# Patient Record
Sex: Female | Born: 1980 | Race: White | Hispanic: No | Marital: Single | Smoking: Current every day smoker
Health system: Southern US, Community
[De-identification: ages and names within clinical notes are randomized; demographics above are authoritative.]

## PROBLEM LIST (undated history)

## (undated) ENCOUNTER — Emergency Department (HOSPITAL_COMMUNITY): Discharge status: Absent without leave | Payer: Self-pay | Source: Home / Self Care

## (undated) DIAGNOSIS — F1910 Other psychoactive substance abuse, uncomplicated: Secondary | ICD-10-CM

## (undated) DIAGNOSIS — G8929 Other chronic pain: Secondary | ICD-10-CM

## (undated) DIAGNOSIS — F419 Anxiety disorder, unspecified: Secondary | ICD-10-CM

## (undated) HISTORY — PX: NOSE SURGERY: SHX723

---
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
## 2002-06-21 ENCOUNTER — Emergency Department (HOSPITAL_COMMUNITY): Admission: EM | Admit: 2002-06-21 | Discharge: 2002-06-21 | Payer: Self-pay | Admitting: *Deleted

## 2002-06-21 ENCOUNTER — Encounter: Payer: Self-pay | Admitting: *Deleted

## 2002-11-06 ENCOUNTER — Ambulatory Visit (HOSPITAL_COMMUNITY): Admission: AD | Admit: 2002-11-06 | Discharge: 2002-11-07 | Payer: Self-pay | Admitting: Obstetrics and Gynecology

## 2003-01-05 ENCOUNTER — Inpatient Hospital Stay (HOSPITAL_COMMUNITY): Admission: AD | Admit: 2003-01-05 | Discharge: 2003-01-08 | Payer: Self-pay | Admitting: Obstetrics and Gynecology

## 2003-11-24 ENCOUNTER — Emergency Department (HOSPITAL_COMMUNITY): Admission: EM | Admit: 2003-11-24 | Discharge: 2003-11-25 | Payer: Self-pay | Admitting: *Deleted

## 2005-01-01 ENCOUNTER — Ambulatory Visit (HOSPITAL_COMMUNITY): Admission: RE | Admit: 2005-01-01 | Discharge: 2005-01-01 | Payer: Self-pay | Admitting: General Surgery

## 2005-02-03 ENCOUNTER — Emergency Department (HOSPITAL_COMMUNITY): Admission: EM | Admit: 2005-02-03 | Discharge: 2005-02-03 | Payer: Self-pay | Admitting: *Deleted

## 2005-06-26 ENCOUNTER — Ambulatory Visit (HOSPITAL_COMMUNITY): Admission: RE | Admit: 2005-06-26 | Discharge: 2005-06-26 | Payer: Self-pay | Admitting: Family Medicine

## 2009-07-10 ENCOUNTER — Ambulatory Visit (HOSPITAL_COMMUNITY): Admission: RE | Admit: 2009-07-10 | Discharge: 2009-07-10 | Payer: Self-pay | Admitting: Family Medicine

## 2010-07-27 ENCOUNTER — Emergency Department (HOSPITAL_COMMUNITY)
Admission: EM | Admit: 2010-07-27 | Discharge: 2010-07-27 | Payer: Self-pay | Source: Home / Self Care | Admitting: Emergency Medicine

## 2010-09-08 ENCOUNTER — Encounter: Payer: Self-pay | Admitting: Family Medicine

## 2011-01-04 NOTE — Op Note (Signed)
   NAME:  Lauren Hunt, Lauren Hunt                          ACCOUNT NO.:  [id number]   MEDICAL RECORD NO.:  [id number]                   PATIENT TYPE:  OBV   LOCATION:  LDR1                                 FACILITY:  [facility]   PHYSICIAN:  Lazaro Arms, M.D.                DATE OF BIRTH:  [birth year]   DATE OF PROCEDURE:  DATE OF DISCHARGE:                                 OPERATIVE REPORT   DELIVERY NOTE:  The patient received her epidural and shortly thereafter  complained of lots of rectal pressure and was noted to be fully dilated at  +2 station.  She pushed effectively over about 30 minutes and delivered a  viable female infant at 67.  The mouth and nose were suctioned on the  perineum.  No meconium was seen in the fluid.  Apgars are 9 and 9, weight 7  pounds 14 ounces. Twenty units of Pitocin diluted in 1000 mL of lactated  Ringer's was infused rapidly IV.  The placenta separated spontaneously and  was delivered via controlled cord traction at 1400.  It was inspected and  appears to be intact with a three-vessel cord.  The vagina was then  inspected and a shallow second degree laceration was noted.  It was  infiltrated with 10 mL of 1% Xylocaine and repaired with a 2-0 Vicryl  suture.  The epidural catheter was then removed with the blue tip visualized  as being intact.  Estimated blood loss 300 mL.     Jacklyn Shell, C.N.M.          Lazaro Arms, M.D.    FC/MEDQ  D:  01/06/2003  T:  01/06/2003  Job:  [id number]

## 2011-01-04 NOTE — H&P (Signed)
NAME:  ICYSS, SKOG                          ACCOUNT NO.:  [id number]   MEDICAL RECORD NO.:  [id number]                   PATIENT TYPE:  OIB   LOCATION:  A415                                 FACILITY:  [facility]   PHYSICIAN:  Lazaro Arms, M.D.                DATE OF BIRTH:  12-28-1980   DATE OF ADMISSION:  01/05/2003  DATE OF DISCHARGE:                                HISTORY & PHYSICAL   HISTORY OF PRESENT ILLNESS:  Ashleymarie is a 30 year old white female, gravida 1,  para 0, abortus 0, with estimated date of delivery of Dec 31, 2002,  currently at 40-5/[redacted] weeks gestation based on Jan 05, 2003, admission, with  an unfavorable cervix in the office on Jan 03, 2003.  The cervix is  basically long, thick, and closed, vertex, and ballotable.  Estimated fetal  weight by Leopold's is approximately 7-1/2 to 8 pounds, so a normal size  baby.  Pregnancy has otherwise been uncomplicated except for  gastroesophageal reflux which has responded to Prilosec.   PAST MEDICAL HISTORY:  1. Anxiety.  2. Migraines.   PAST SURGICAL HISTORY:  Nose surgery for deviated septum.   PAST OBSTETRICAL HISTORY:  She is nulliparous.   ALLERGIES:  No known drug allergies.   MEDICATIONS:  1. Prenatal vitamins.  2. Tylenol No.3 occasionally for headaches.  3. Prilosec.   SOCIAL HISTORY:  She smokes five cigarettes a day, does not drink alcohol or  do drugs.   LABORATORY DATA:  Blood type of B negative.  Father is also O negative.  RhoGAM was not done.  Urine drug screen was negative.  Rubella is immune.  Hepatitis B surface antigen was negative.  HIV was nonreactive.  Serology  was nonreactive.  Her Pap smear was normal.  GC and Chlamydia were both  negative.  AFT was normal.  Her Group B Strep was negative.  Her one hour  GTT was 106.   PHYSICAL EXAMINATION:  HEENT:  Unremarkable.  NECK:  Thyroid is normal.  LUNGS:  Clear.  BREASTS:  Deferred.  ABDOMEN:  Fundal height of 36 cm.  PELVIC:  Cervix is long,  thick, and closed.  EXTREMITIES:  Trace edema.  NEUROLOGIC:  Grossly intact.    IMPRESSION:  1. Intrauterine pregnancy at 40-5/[redacted] weeks gestation.  2. Unfavorable cervix.   PLAN:  The patient is admitted for cervical ripening with Cervidil, followed  by Pitocin induction.  She understands it will probably be a two day  process, and she is requesting an epidural to be placed when appropriate.                                                Lazaro Arms, M.D.    Loraine Maple  D:  01/03/2003  T:  01/03/2003  Job:  [id number]

## 2011-01-04 NOTE — Op Note (Signed)
[name], [name]                ACCOUNT NO.:  [id number]   MEDICAL RECORD NO.:  [id number]          PATIENT TYPE:  AMB   LOCATION:  DAY                           FACILITY:  [facility]   PHYSICIAN:  Jerolyn Shin C. Katrinka Blazing, M.D.   DATE OF BIRTH:  Sep 29, 1980   DATE OF PROCEDURE:  01/01/2005  DATE OF DISCHARGE:                                 OPERATIVE REPORT   PREOPERATIVE DIAGNOSIS:  Mass left upper arm.   POSTOPERATIVE DIAGNOSIS:  Subfascial mass left upper arm.   PROCEDURE:  Excision of subfascial mass left upper arm.   SURGEON:  Dirk Dress. Katrinka Blazing, M.D.   DESCRIPTION:  Under general anesthesia the patient's left arm was prepped  and draped in the sterile field. Longitudinal incision was made over the  mass which was on the posterior aspect of the biceps muscle. The incision  was extended through the subcutaneous tissue down to the fascia. The mass  was subfascial. The fascia of the biceps muscle was split longitudinally.  Using blunt dissection and a longitudinal plane, the mass with sector was  dissected. The base of the mass was densely adherent to the underlying  muscle. The mass was excised with small sliver of muscle beneath it.  It was  sent for pathologic evaluation. The fascia was closed with interrupted 2-0  Monocryl. Subcutaneous tissue was closed with interrupted 2-0 Monocryl. Skin  was closed with running 3-0 Prolene. The patient tolerated procedure well.  Sterile dressing was placed. She was awakened from anesthesia, transferred  to a bed and taken to the postanesthetic care unit for monitoring.      LCS/MEDQ  D:  01/01/2005  T:  01/01/2005  Job:  [id number]   cc:   Lorin Picket A. Gerda Diss, MD  [address]  [place]  [place] [address]  Fax: [phone]

## 2011-01-04 NOTE — Op Note (Signed)
   NAME:  Lauren Hunt, Lauren Hunt                          ACCOUNT NO.:  [id number]   MEDICAL RECORD NO.:  [id number]                   PATIENT TYPE:  OBV   LOCATION:  LDR1                                 FACILITY:  [facility]   PHYSICIAN:  Tilda Burrow, M.D.              DATE OF BIRTH:  Mar 16, 1981   DATE OF PROCEDURE:  01/06/2003  DATE OF DISCHARGE:                                 OPERATIVE REPORT   PROCEDURE:  Epidural catheter placement.   DESCRIPTION OF PROCEDURE:  Continuous lumbar epidural catheter placed using  loss of resistance technique at L2-3 interspace on the first attempt.  Skin  was prepped and draped.  We were able to enter the skin just above the  butterfly tattoo, between the antennae.   Loss of resistance was used, identifying the epidural space at a depth of 5  cm, with 5 mL of 1.5% Xylocaine with epinephrine instilled, followed by  placement of the epidural catheter 3 cm into the epidural space without  difficulty.  It was taped to the back and placed in a continuous infusion  with a 5 mL bolus and 12 cc/hr.  Analgesic level of T12 was achieved.  The  patient made steady progress and at the end of the epidural had some urge to  push.  She was 9 cm dilated and with two pushes was completely dilated and  allowed to push further.  Good analgesic effect achieved.                                               Tilda Burrow, M.D.    JVF/MEDQ  D:  01/06/2003  T:  01/06/2003  Job:  [id number]

## 2011-01-04 NOTE — H&P (Signed)
Lauren Hunt, Lauren Hunt                ACCOUNT NO.:  [id number]   MEDICAL RECORD NO.:  [id number]          PATIENT TYPE:  AMB   LOCATION:  DAY                           FACILITY:  [facility]   PHYSICIAN:  Jerolyn Shin C. Katrinka Blazing, M.D.   DATE OF BIRTH:  [birth year]   DATE OF ADMISSION:  DATE OF DISCHARGE:  LH                                HISTORY & PHYSICAL   HISTORY OF PRESENT ILLNESS:  A 30 year old female with history of a small  mass on the left upper forearm.  The mass is increasing in size and has  become more painful.  No history of injury.  She has no other masses.  The  patient is scheduled to have the mass excised under anesthesia.   PAST HISTORY:  She has no chronic illness.   MEDICATIONS:  She takes no chronic medications.   PAST SURGICAL HISTORY:  Her only surgery was nasal surgery and a cesarean  section.   FAMILY HISTORY:  Positive for diabetes.   SOCIAL HISTORY:  She smokes a pack of cigarettes per day.  She is single.  She is employed as a Associate Professor.  She does not drink or use drugs.   PHYSICAL EXAMINATION:  VITAL SIGNS:  Blood pressure 114/72, pulse 74,  respirations 18, weight 128 pounds.  HEENT:  Unremarkable.  NECK:  Supple.  No JVD, bruit, adenopathy, or thyromegaly.  CHEST:  Clear to auscultation.  HEART:  Regular rate and rhythm without murmur, gallop, or rub.  ABDOMEN:  Soft, nontender.  No masses.  EXTREMITIES:  3 cm firm mass, left upper outer arm with some subcutaneous  tissue fixation.  No erythema.  No other nodules noted.  NEUROLOGIC:  No focal motor, sensory, or cerebellar deficit.   IMPRESSION:  Soft tissue mass, left upper arm.   PLAN:  Excision under anesthesia at patient's request.      LCS/MEDQ  D:  12/31/2004  T:  12/31/2004  Job:  [id number]   cc:   Short Stay, [facility]

## 2011-06-26 ENCOUNTER — Emergency Department (HOSPITAL_COMMUNITY)
Admission: EM | Admit: 2011-06-26 | Discharge: 2011-06-26 | Disposition: A | Discharge status: Home / Self Care | Payer: Self-pay | Attending: Emergency Medicine | Admitting: Emergency Medicine

## 2011-06-26 ENCOUNTER — Encounter: Payer: Self-pay | Admitting: Emergency Medicine

## 2011-06-26 DIAGNOSIS — K0889 Other specified disorders of teeth and supporting structures: Secondary | ICD-10-CM

## 2011-06-26 DIAGNOSIS — K089 Disorder of teeth and supporting structures, unspecified: Secondary | ICD-10-CM | POA: Insufficient documentation

## 2011-06-26 MED ORDER — PENICILLIN V POTASSIUM 250 MG PO TABS
500.0000 mg | ORAL_TABLET | Freq: Once | ORAL | Status: AC
  Administered 2011-06-26: 500 mg via ORAL
  Filled 2011-06-26: qty 2

## 2011-06-26 MED ORDER — HYDROCODONE-ACETAMINOPHEN 5-325 MG PO TABS
ORAL_TABLET | ORAL | Status: AC
Start: 2011-06-26 — End: 2011-07-06

## 2011-06-26 MED ORDER — PENICILLIN V POTASSIUM 500 MG PO TABS: 500.0000 mg | ORAL_TABLET | Freq: Four times a day (QID) | ORAL | Status: AC

## 2011-06-26 MED ORDER — OXYCODONE-ACETAMINOPHEN 5-325 MG PO TABS
1.0000 | ORAL_TABLET | Freq: Once | ORAL | Status: AC
  Administered 2011-06-26: 1 via ORAL
  Filled 2011-06-26: qty 1

## 2011-06-26 NOTE — ED Notes (Signed)
Pt c/o left lower toothache with swelling x 2 days.

## 2011-06-26 NOTE — ED Notes (Signed)
Tammy PA in prior to RN, see PA assessment for further 

## 2011-06-26 NOTE — ED Provider Notes (Signed)
History     CSN: [id number] Arrival date & time: 06/26/2011  7:50 PM   First MD Initiated Contact with Patient 06/26/11 1914      Chief Complaint  Patient presents with  . Dental Pain    (Consider location/radiation/quality/duration/timing/severity/associated sxs/prior treatment) Patient is a 30 y.o. female presenting with tooth pain. The history is provided by the patient.  Dental PainThe primary symptoms include mouth pain. Primary symptoms do not include oral bleeding, headaches, fever, sore throat or angioedema. The symptoms began 2 days ago. The symptoms are worsening. The symptoms are new. The symptoms occur constantly.  Mouth pain began 24 -48 hours ago. Mouth pain occurs constantly. Mouth pain is worsening. Affected locations include: teeth.  Additional symptoms include: dental sensitivity to temperature, gum swelling and gum tenderness. Additional symptoms do not include: purulent gums, trismus, jaw pain, facial swelling, trouble swallowing, pain with swallowing, drooling, ear pain, swollen glands and fatigue. Medical issues include: smoking and periodontal disease.    History reviewed. No pertinent past medical history.  Past Surgical History  Procedure Date  . Nose surgery     History reviewed. No pertinent family history.  History  Substance Use Topics  . Smoking status: Current Everyday Smoker  . Smokeless tobacco: Not on file  . Alcohol Use:     OB History    Grav Para Term Preterm Abortions TAB SAB Ect Mult Living                  Review of Systems  Constitutional: Negative for fever, chills and fatigue.  HENT: Positive for dental problem. Negative for ear pain, sore throat, facial swelling, drooling, trouble swallowing, neck pain and neck stiffness.   Cardiovascular: Negative for palpitations.  Gastrointestinal: Negative for nausea and vomiting.  Musculoskeletal: Negative for myalgias, back pain and arthralgias.  Skin: Negative for rash.  Neurological:  Negative for dizziness, weakness, numbness and headaches.  Hematological: Negative for adenopathy. Does not bruise/bleed easily.  All other systems reviewed and are negative.    Allergies  Review of patient's allergies indicates no known allergies.  Home Medications   Current Outpatient Rx  Name Route Sig Dispense Refill  . GOODY HEADACHE PO Oral Take 1 packet by mouth as needed. For tooth pain     . IBUPROFEN 200 MG PO TABS Oral Take 1,000 mg by mouth as needed. For tooth pain       BP 139/84  Pulse 99  Temp 98.3 F (36.8 C)  Resp 20  Ht 5\' 2"  (1.575 m)  Wt 127 lb (57.607 kg)  BMI 23.23 kg/m2  SpO2 100%  LMP 06/12/2011  Physical Exam  Nursing note and vitals reviewed. Constitutional: She is oriented to person, place, and time. She appears well-developed and well-nourished. No distress.  HENT:  Head: Normocephalic and atraumatic. No trismus in the jaw.  Right Ear: Tympanic membrane normal. No mastoid tenderness. No hemotympanum.  Left Ear: Tympanic membrane normal. No mastoid tenderness. No hemotympanum.  Mouth/Throat: Uvula is midline, oropharynx is clear and moist and mucous membranes are normal. She does not have dentures. Dental abscesses and dental caries present. No uvula swelling.  Neck: Normal range of motion. Neck supple.  Cardiovascular: Normal rate, regular rhythm and normal heart sounds.   Pulmonary/Chest: Effort normal and breath sounds normal. No respiratory distress. She exhibits no tenderness.  Lymphadenopathy:    She has no cervical adenopathy.  Neurological: She is alert and oriented to person, place, and time. No cranial nerve deficit. She  exhibits normal muscle tone. Coordination normal.  Skin: Skin is warm and dry.  Psychiatric: She has a normal mood and affect.    ED Course  Procedures (including critical care time)       MDM     8:31 PM Dental caries and discoloration of the left lower second molar.  Small pustule to the lateral  gumline.  No trismus, no facial swelling.       Koichi Platte L. [place], [place] 06/26/11 2034

## 2011-06-27 NOTE — ED Provider Notes (Signed)
Medical screening examination/treatment/procedure(s) were performed by non-physician practitioner and as supervising physician I was immediately available for consultation/collaboration.   Joya Gaskins, MD 06/27/11 1341

## 2012-11-05 ENCOUNTER — Emergency Department (HOSPITAL_COMMUNITY)
Admission: EM | Admit: 2012-11-05 | Discharge: 2012-11-05 | Disposition: A | Discharge status: Home / Self Care | Payer: Self-pay | Attending: Emergency Medicine | Admitting: Emergency Medicine

## 2012-11-05 ENCOUNTER — Encounter (HOSPITAL_COMMUNITY): Payer: Self-pay | Admitting: *Deleted

## 2012-11-05 DIAGNOSIS — F17200 Nicotine dependence, unspecified, uncomplicated: Secondary | ICD-10-CM | POA: Insufficient documentation

## 2012-11-05 DIAGNOSIS — K0889 Other specified disorders of teeth and supporting structures: Secondary | ICD-10-CM

## 2012-11-05 DIAGNOSIS — K089 Disorder of teeth and supporting structures, unspecified: Secondary | ICD-10-CM | POA: Insufficient documentation

## 2012-11-05 MED ORDER — PENICILLIN V POTASSIUM 500 MG PO TABS: 500.0000 mg | ORAL_TABLET | Freq: Three times a day (TID) | ORAL | Status: DC

## 2012-11-05 MED ORDER — HYDROCODONE-ACETAMINOPHEN 5-325 MG PO TABS
1.0000 | ORAL_TABLET | Freq: Once | ORAL | Status: AC
  Administered 2012-11-05: 1 via ORAL
  Filled 2012-11-05: qty 1

## 2012-11-05 MED ORDER — HYDROCODONE-ACETAMINOPHEN 5-325 MG PO TABS: 1.0000 | ORAL_TABLET | ORAL | Status: DC | PRN

## 2012-11-05 MED ORDER — PENICILLIN V POTASSIUM 250 MG PO TABS
500.0000 mg | ORAL_TABLET | Freq: Once | ORAL | Status: AC
  Administered 2012-11-05: 500 mg via ORAL
  Filled 2012-11-05: qty 2

## 2012-11-05 MED ORDER — IBUPROFEN 800 MG PO TABS
800.0000 mg | ORAL_TABLET | Freq: Once | ORAL | Status: AC
  Administered 2012-11-05: 800 mg via ORAL
  Filled 2012-11-05: qty 1

## 2012-11-05 NOTE — ED Provider Notes (Signed)
History     CSN: [id number]  Arrival date & time 11/05/12  0341   First MD Initiated Contact with Patient 11/05/12 [phone]      Chief Complaint  Patient presents with  . Dental Pain    pain shoots into left jaw area    (Consider location/radiation/quality/duration/timing/severity/associated sxs/prior treatment) HPI Lauren Hunt is a 32 y.o. female who presents to the Emergency Department complaining of dental pain that began two days ago in the lower left hand tooth. Now pain has radiated to the jaw and ear particularly when she swallows. Cold air has affected the tooth. She is aware of breaking the tooth in the last two months. It formerly had a filling in the tooth.  PCP Dr. Gerda Diss History reviewed. No pertinent past medical history.  Past Surgical History  Procedure Laterality Date  . Nose surgery      No family history on file.  History  Substance Use Topics  . Smoking status: Current Every Day Smoker -- 0.50 packs/day    Types: Cigarettes  . Smokeless tobacco: Not on file  . Alcohol Use: Not on file    OB History   Grav Para Term Preterm Abortions TAB SAB Ect Mult Living                  Review of Systems  Constitutional: Negative for fever.       10 Systems reviewed and are negative for acute change except as noted in the HPI.  HENT: Positive for dental problem. Negative for congestion.   Eyes: Negative for discharge and redness.  Respiratory: Negative for cough and shortness of breath.   Cardiovascular: Negative for chest pain.  Gastrointestinal: Negative for vomiting and abdominal pain.  Musculoskeletal: Negative for back pain.  Skin: Negative for rash.  Neurological: Negative for syncope, numbness and headaches.  Psychiatric/Behavioral:       No behavior change.    Allergies  Review of patient's allergies indicates no known allergies.  Home Medications   Current Outpatient Rx  Name  Route  Sig  Dispense  Refill  . Aspirin-Acetaminophen-Caffeine  (GOODY HEADACHE PO)   Oral   Take 1 packet by mouth as needed. For tooth pain          . ibuprofen (ADVIL,MOTRIN) 200 MG tablet   Oral   Take 1,000 mg by mouth as needed. For tooth pain            BP 121/86  Pulse 94  Temp(Src) 98.6 F (37 C) (Oral)  Resp 20  Ht 5\' 2"  (1.575 m)  Wt 135 lb (61.236 kg)  BMI 24.69 kg/m2  SpO2 100%  LMP 11/05/2012  Physical Exam  Constitutional: She appears well-developed and well-nourished.  HENT:  Head: Normocephalic.  Right Ear: External ear normal.  Left Ear: External ear normal.  Mouth/Throat: Oropharynx is clear and moist.  Tooth 18 broken outer side, no abscess present.  Cardiovascular: Normal rate and intact distal pulses.   Pulmonary/Chest: Breath sounds normal.    ED Course  Procedures (including critical care time)    MDM  Patient with broken tooth 18 and pain. Given penicillin, ibuprofen, and hydrocodone. Referral to dentist. Pt stable in ED with no significant deterioration in condition.The patient appears reasonably screened and/or stabilized for discharge and I doubt any other medical condition or other [facility] requiring further screening, evaluation, or treatment in the ED at this time prior to discharge.  MDM Reviewed: nursing note and vitals  Nicoletta Dress. Colon Branch, MD 11/05/12 [phone]

## 2013-10-20 ENCOUNTER — Encounter (HOSPITAL_COMMUNITY): Payer: Self-pay | Admitting: Emergency Medicine

## 2013-10-20 ENCOUNTER — Emergency Department (HOSPITAL_COMMUNITY)
Admission: EM | Admit: 2013-10-20 | Discharge: 2013-10-20 | Disposition: A | Discharge status: Home / Self Care | Payer: Self-pay | Attending: Emergency Medicine | Admitting: Emergency Medicine

## 2013-10-20 ENCOUNTER — Emergency Department (HOSPITAL_COMMUNITY): Payer: Self-pay

## 2013-10-20 DIAGNOSIS — M549 Dorsalgia, unspecified: Secondary | ICD-10-CM

## 2013-10-20 DIAGNOSIS — N390 Urinary tract infection, site not specified: Secondary | ICD-10-CM | POA: Insufficient documentation

## 2013-10-20 DIAGNOSIS — F17200 Nicotine dependence, unspecified, uncomplicated: Secondary | ICD-10-CM | POA: Insufficient documentation

## 2013-10-20 DIAGNOSIS — M545 Low back pain, unspecified: Secondary | ICD-10-CM | POA: Insufficient documentation

## 2013-10-20 DIAGNOSIS — Z3202 Encounter for pregnancy test, result negative: Secondary | ICD-10-CM | POA: Insufficient documentation

## 2013-10-20 DIAGNOSIS — M79609 Pain in unspecified limb: Secondary | ICD-10-CM | POA: Insufficient documentation

## 2013-10-20 LAB — URINALYSIS, ROUTINE W REFLEX MICROSCOPIC
Bilirubin Urine: NEGATIVE
Glucose, UA: NEGATIVE mg/dL
Ketones, ur: NEGATIVE mg/dL
Nitrite: POSITIVE — AB
PH: 5.5 (ref 5.0–8.0)
Protein, ur: 100 mg/dL — AB
SPECIFIC GRAVITY, URINE: 1.025 (ref 1.005–1.030)
UROBILINOGEN UA: 0.2 mg/dL (ref 0.0–1.0)

## 2013-10-20 LAB — URINE MICROSCOPIC-ADD ON

## 2013-10-20 LAB — POC URINE PREG, ED: Preg Test, Ur: NEGATIVE

## 2013-10-20 MED ORDER — OXYCODONE-ACETAMINOPHEN 5-325 MG PO TABS
1.0000 | ORAL_TABLET | Freq: Once | ORAL | Status: AC
  Administered 2013-10-20: 1 via ORAL
  Filled 2013-10-20: qty 1

## 2013-10-20 MED ORDER — CYCLOBENZAPRINE HCL 10 MG PO TABS: 10.0000 mg | ORAL_TABLET | Freq: Three times a day (TID) | ORAL | Status: DC | PRN

## 2013-10-20 MED ORDER — HYDROCODONE-ACETAMINOPHEN 5-325 MG PO TABS
ORAL_TABLET | ORAL | Status: AC
Start: 2013-10-20 — End: ?

## 2013-10-20 MED ORDER — CYCLOBENZAPRINE HCL 10 MG PO TABS
10.0000 mg | ORAL_TABLET | Freq: Once | ORAL | Status: AC
  Administered 2013-10-20: 10 mg via ORAL
  Filled 2013-10-20: qty 1

## 2013-10-20 MED ORDER — CEPHALEXIN 500 MG PO CAPS
500.0000 mg | ORAL_CAPSULE | Freq: Once | ORAL | Status: AC
  Administered 2013-10-20: 500 mg via ORAL
  Filled 2013-10-20: qty 1

## 2013-10-20 MED ORDER — CEPHALEXIN 500 MG PO CAPS: 500.0000 mg | ORAL_CAPSULE | Freq: Four times a day (QID) | ORAL | Status: DC

## 2013-10-20 NOTE — ED Notes (Signed)
Pt with left lower back pain that radiates down into left hip, unable to get comfortable, tried aleve without relief, took percocet with some relief  Yesterday, recalls bending over about 4 days ago

## 2013-10-20 NOTE — Discharge Instructions (Signed)
Back Pain, Adult °Back pain is very common. The pain often gets better over time. The cause of back pain is usually not dangerous. Most people can learn to manage their back pain on their own.  °HOME CARE  °· Stay active. Start with short walks on flat ground if you can. Try to walk farther each day. °· Do not sit, drive, or stand in one place for more than 30 minutes. Do not stay in bed. °· Do not avoid exercise or work. Activity can help your back heal faster. °· Be careful when you bend or lift an object. Bend at your knees, keep the object close to you, and do not twist. °· Sleep on a firm mattress. Lie on your side, and bend your knees. If you lie on your back, put a pillow under your knees. °· Only take medicines as told by your doctor. °· Put ice on the injured area. °· Put ice in a plastic bag. °· Place a towel between your skin and the bag. °· Leave the ice on for 15-20 minutes, 03-04 times a day for the first 2 to 3 days. After that, you can switch between ice and heat packs. °· Ask your doctor about back exercises or massage. °· Avoid feeling anxious or stressed. Find good ways to deal with stress, such as exercise. °GET HELP RIGHT AWAY IF:  °· Your pain does not go away with rest or medicine. °· Your pain does not go away in 1 week. °· You have new problems. °· You do not feel well. °· The pain spreads into your legs. °· You cannot control when you poop (bowel movement) or pee (urinate). °· Your arms or legs feel weak or lose feeling (numbness). °· You feel sick to your stomach (nauseous) or throw up (vomit). °· You have belly (abdominal) pain. °· You feel like you may pass out (faint). °MAKE SURE YOU:  °· Understand these instructions. °· Will watch your condition. °· Will get help right away if you are not doing well or get worse. °Document Released: 01/22/2008 Document Revised: 10/28/2011 Document Reviewed: 12/24/2010 °ExitCare® Patient Information ©2014 [employer]. ° °Urinary Tract Infection °A  urinary tract infection (UTI) can occur any place along the urinary tract. The tract includes the kidneys, ureters, bladder, and urethra. A type of germ called bacteria often causes a UTI. UTIs are often helped with antibiotic medicine.  °HOME CARE  °· If given, take antibiotics as told by your doctor. Finish them even if you start to feel better. °· Drink enough fluids to keep your pee (urine) clear or pale yellow. °· Avoid tea, drinks with caffeine, and bubbly (carbonated) drinks. °· Pee often. Avoid holding your pee in for a long time. °· Pee before and after having sex (intercourse). °· Wipe from front to back after you poop (bowel movement) if you are a woman. Use each tissue only once. °GET HELP RIGHT AWAY IF:  °· You have back pain. °· You have lower belly (abdominal) pain. °· You have chills. °· You feel sick to your stomach (nauseous). °· You throw up (vomit). °· Your burning or discomfort with peeing does not go away. °· You have a fever. °· Your symptoms are not better in 3 days. °MAKE SURE YOU:  °· Understand these instructions. °· Will watch your condition. °· Will get help right away if you are not doing well or get worse. °Document Released: 01/22/2008 Document Revised: 04/29/2012 Document Reviewed: 03/05/2012 °Exit[employer]® Patient Information ©2014   [employer]. ° °

## 2013-10-20 NOTE — ED Notes (Signed)
C/o back pain radiating into left leg, states she bent over 4 days ago and is unable to get comfortable, taking aleve without relief

## 2013-10-20 NOTE — ED Notes (Signed)
Unable to obtain oral temp due to pt eating and drinking

## 2013-10-20 NOTE — ED Notes (Signed)
Pt verbalized understanding of not to drive within 4 hours of taking vicodin due to med may cause drowsiness

## 2013-10-20 NOTE — ED Provider Notes (Signed)
CSN: [id number]     Arrival date & time 10/20/13  1501 History   First MD Initiated Contact with Patient 10/20/13 1604     Chief Complaint  Patient presents with  . Back Pain     (Consider location/radiation/quality/duration/timing/severity/associated sxs/prior Treatment) Patient is a 33 y.o. female presenting with back pain. The history is provided by the patient.  Back Pain Location:  Lumbar spine Quality:  Shooting and aching Radiates to:  L posterior upper leg, L thigh and L knee Pain severity:  Moderate Pain is:  Same all the time Onset quality:  Sudden Duration:  4 days Timing:  Constant Progression:  Unchanged Chronicity:  New Context comment:  Pain to her back pain 4 days ago after bending over .   Relieved by:  Nothing Worsened by:  Ambulation, bending, twisting and standing Ineffective treatments:  NSAIDs Associated symptoms: leg pain   Associated symptoms: no abdominal pain, no abdominal swelling, no bladder incontinence, no bowel incontinence, no chest pain, no dysuria, no fever, no headaches, no numbness, no paresthesias, no pelvic pain, no perianal numbness, no tingling and no weakness   Associated symptoms comment:  Reports having urinary hesitancy last week but denies sx's currently.   Risk factors: not pregnant, no recent surgery and no vascular disease     History reviewed. No pertinent past medical history. Past Surgical History  Procedure Laterality Date  . Nose surgery     No family history on file. History  Substance Use Topics  . Smoking status: Current Every Day Smoker -- 0.50 packs/day    Types: Cigarettes  . Smokeless tobacco: Not on file  . Alcohol Use: Not on file   OB History   Grav Para Term Preterm Abortions TAB SAB Ect Mult Living                 Review of Systems  Constitutional: Negative for fever.  Respiratory: Negative for shortness of breath.   Cardiovascular: Negative for chest pain.  Gastrointestinal: Negative for vomiting,  abdominal pain, constipation and bowel incontinence.  Genitourinary: Negative for bladder incontinence, dysuria, hematuria, flank pain, decreased urine volume, vaginal bleeding, vaginal discharge, difficulty urinating and pelvic pain.       No perineal numbness or incontinence of urine or feces  Musculoskeletal: Positive for back pain. Negative for joint swelling.  Skin: Negative for rash.  Neurological: Negative for tingling, weakness, numbness, headaches and paresthesias.  All other systems reviewed and are negative.      Allergies  Review of patient's allergies indicates no known allergies.  Home Medications   Current Outpatient Rx  Name  Route  Sig  Dispense  Refill  . ibuprofen (ADVIL,MOTRIN) 200 MG tablet   Oral   Take 400 mg by mouth daily as needed. For tooth pain          BP 114/54  Pulse 110  Temp(Src) 98.2 F (36.8 C) (Oral)  Resp 18  Ht 5\' 1"  (1.549 m)  Wt 137 lb (62.143 kg)  BMI 25.90 kg/m2  SpO2 99%  LMP 10/06/2013 Physical Exam  Nursing note and vitals reviewed. Constitutional: She is oriented to person, place, and time. She appears well-developed and well-nourished. No distress.  HENT:  Head: Normocephalic and atraumatic.  Neck: Normal range of motion. Neck supple.  Cardiovascular: Normal rate, regular rhythm, normal heart sounds and intact distal pulses.   No murmur heard. Pulmonary/Chest: Effort normal and breath sounds normal. No respiratory distress.  Abdominal: Soft. Normal appearance. She exhibits no  distension and no mass. There is no tenderness. There is no rigidity, no rebound, no guarding, no CVA tenderness and no tenderness at McBurney's point.  Musculoskeletal: She exhibits tenderness. She exhibits no edema.       Lumbar back: She exhibits tenderness and pain. She exhibits normal range of motion, no swelling, no deformity, no laceration and normal pulse.  ttp of the lower lumbar spine and paraspinal muscles.   DP pulses are brisk and  symmetrical.  Distal sensation intact.  Hip Flexors/Extensors are intact  Neurological: She is alert and oriented to person, place, and time. She has normal strength. No sensory deficit. She exhibits normal muscle tone. Coordination and gait normal.  Reflex Scores:      Patellar reflexes are 2+ on the right side and 2+ on the left side.      Achilles reflexes are 2+ on the right side and 2+ on the left side. Skin: Skin is warm and dry. No rash noted.    ED Course  Procedures (including critical care time) Labs Review Labs Reviewed  URINALYSIS, ROUTINE W REFLEX MICROSCOPIC - Abnormal; Notable for the following:    APPearance HAZY (*)    Hgb urine dipstick LARGE (*)    Protein, ur 100 (*)    Nitrite POSITIVE (*)    Leukocytes, UA SMALL (*)    All other components within normal limits  URINE MICROSCOPIC-ADD ON - Abnormal; Notable for the following:    Squamous Epithelial / LPF MANY (*)    Bacteria, UA MANY (*)    All other components within normal limits  URINE CULTURE  POC URINE PREG, ED   Imaging Review Dg Thoracic Spine W/swimmers  10/20/2013   CLINICAL DATA:  Low back pain  EXAM: THORACIC SPINE - 2 VIEW + SWIMMERS  COMPARISON:  None.  FINDINGS: There is no evidence of thoracic spine fracture. Alignment is normal. No other significant bone abnormalities are identified.  IMPRESSION: Negative.   Electronically Signed   By: Elige Ko   On: 10/20/2013 17:32   Dg Lumbar Spine Complete  10/20/2013   CLINICAL DATA:  Low back pain  EXAM: LUMBAR SPINE - COMPLETE 4+ VIEW  COMPARISON:  None.  FINDINGS: There are 5 nonrib bearing lumbar-type vertebral bodies. The vertebral body heights are maintained. The alignment is anatomic. There is no spondylolysis. There is a small radiodense fragment adjacent to the right superior articulating facet of L5 which may reflect radiodense material within the overlying colon versus a fracture. There is no other acute fracture or static listhesis. The disc spaces  are maintained.  The SI joints are unremarkable.  IMPRESSION: There is a small radiodense fragment adjacent to the right superior articulating facet of L5 which may reflect radiodense material within the overlying colon versus a fracture. Correlate with clinical exam.  There is no other acute fracture.   Electronically Signed   By: Elige Ko   On: 10/20/2013 17:35     EKG Interpretation None      MDM   Final diagnoses:  Back pain  Urinary tract infection   Urine culture pending  Patient advised of labs and XR findings.  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  She is ambulatory, no focal neuro deficits, no concerning symptoms for emergent neurological process, pyelo or kidney stone.  No hx of recent fall, low back pain began after bending over.   Discussed XR findings with Dr. Allena Katz, radiologist, and he  suggested non-contrast MRI of L spine to delineate nature of radiodense fragment at L5.  Patient scheduled to return here tomorrow, 10/21/2013 for the  MRI.  Patient agrees to keflex, hydrocodone for pain and close f/u with her PMD this week.  Also agrees to return here if her sx's worsen.    The patient appears reasonably screened and/or stabilized for discharge and I doubt any other medical condition or other [facility] requiring further screening, evaluation, or treatment in the ED at this time prior to discharge.   Jaselynn Tamas L. Trisha Mangleriplett, PA-C 10/22/13 1746

## 2013-10-21 ENCOUNTER — Ambulatory Visit (HOSPITAL_COMMUNITY): Admit: 2013-10-21 | Payer: Self-pay

## 2013-10-23 LAB — URINE CULTURE: Colony Count: >=100000

## 2013-10-24 ENCOUNTER — Telehealth (HOSPITAL_BASED_OUTPATIENT_CLINIC_OR_DEPARTMENT_OTHER): Payer: Self-pay | Admitting: Emergency Medicine

## 2013-10-24 NOTE — Telephone Encounter (Signed)
Post ED Visit - Positive Culture Follow-up  Culture report reviewed by antimicrobial stewardship pharmacist: []  Wes Dulaney, Pharm.D., BCPS [x]  Celedonio MiyamotoJeremy Frens, Pharm.D., BCPS []  Georgina PillionElizabeth Martin, Pharm.D., BCPS []  MinklerMinh Pham, [address] BoulevardPharm.D., BCPS, AAHIVP []  Estella HuskMichelle Turner, Pharm.D., BCPS, AAHIVP  Positive urine culture Treated with Keflex, organism sensitive to the same and no further patient follow-up is required at this time.  Marcelle OverlieHolland, Jenel LucksKylie 10/24/2013, 11:58 AM

## 2013-10-25 ENCOUNTER — Ambulatory Visit (HOSPITAL_COMMUNITY)
Admission: RE | Admit: 2013-10-25 | Discharge: 2013-10-25 | Disposition: A | Discharge status: Home / Self Care | Payer: Self-pay | Source: Ambulatory Visit | Attending: Emergency Medicine | Admitting: Emergency Medicine

## 2013-10-25 DIAGNOSIS — M47817 Spondylosis without myelopathy or radiculopathy, lumbosacral region: Secondary | ICD-10-CM | POA: Insufficient documentation

## 2013-10-25 DIAGNOSIS — M5126 Other intervertebral disc displacement, lumbar region: Secondary | ICD-10-CM | POA: Insufficient documentation

## 2013-10-25 NOTE — ED Provider Notes (Signed)
Medical screening examination/treatment/procedure(s) were performed by non-physician practitioner and as supervising physician I was immediately available for consultation/collaboration.   EKG Interpretation None     ]   Atlee Kluth L Deane Wattenbarger, MD 10/25/13 0715 

## 2016-06-04 ENCOUNTER — Other Ambulatory Visit (HOSPITAL_COMMUNITY): Payer: Self-pay | Admitting: Otolaryngology

## 2016-06-04 DIAGNOSIS — J340 Abscess, furuncle and carbuncle of nose: Secondary | ICD-10-CM

## 2016-06-05 ENCOUNTER — Encounter (HOSPITAL_COMMUNITY): Payer: Self-pay

## 2016-06-05 ENCOUNTER — Ambulatory Visit (HOSPITAL_COMMUNITY)
Admission: RE | Admit: 2016-06-05 | Discharge: 2016-06-05 | Disposition: A | Discharge status: Home / Self Care | Payer: Self-pay | Source: Ambulatory Visit | Attending: Otolaryngology | Admitting: Otolaryngology

## 2016-06-05 DIAGNOSIS — Z9889 Other specified postprocedural states: Secondary | ICD-10-CM | POA: Insufficient documentation

## 2016-06-05 DIAGNOSIS — J340 Abscess, furuncle and carbuncle of nose: Secondary | ICD-10-CM | POA: Insufficient documentation

## 2016-06-05 DIAGNOSIS — J0100 Acute maxillary sinusitis, unspecified: Secondary | ICD-10-CM | POA: Insufficient documentation

## 2016-06-05 MED ORDER — IOPAMIDOL (ISOVUE-300) INJECTION 61%
INTRAVENOUS | Status: AC
  Administered 2016-06-05: 75 mL
  Filled 2016-06-05: qty 75

## 2017-06-27 ENCOUNTER — Encounter (HOSPITAL_COMMUNITY): Payer: Self-pay | Admitting: Emergency Medicine

## 2017-06-27 ENCOUNTER — Emergency Department (HOSPITAL_COMMUNITY)
Admission: EM | Admit: 2017-06-27 | Discharge: 2017-06-27 | Disposition: A | Discharge status: Home / Self Care | Payer: No Typology Code available for payment source | Attending: Emergency Medicine | Admitting: Emergency Medicine

## 2017-06-27 ENCOUNTER — Emergency Department (HOSPITAL_COMMUNITY): Payer: No Typology Code available for payment source

## 2017-06-27 ENCOUNTER — Other Ambulatory Visit: Payer: Self-pay

## 2017-06-27 DIAGNOSIS — Y999 Unspecified external cause status: Secondary | ICD-10-CM | POA: Insufficient documentation

## 2017-06-27 DIAGNOSIS — R0789 Other chest pain: Secondary | ICD-10-CM | POA: Insufficient documentation

## 2017-06-27 DIAGNOSIS — Y9389 Activity, other specified: Secondary | ICD-10-CM | POA: Diagnosis not present

## 2017-06-27 DIAGNOSIS — S0083XA Contusion of other part of head, initial encounter: Secondary | ICD-10-CM | POA: Insufficient documentation

## 2017-06-27 DIAGNOSIS — S299XXA Unspecified injury of thorax, initial encounter: Secondary | ICD-10-CM | POA: Insufficient documentation

## 2017-06-27 DIAGNOSIS — Y92410 Unspecified street and highway as the place of occurrence of the external cause: Secondary | ICD-10-CM | POA: Insufficient documentation

## 2017-06-27 DIAGNOSIS — F17210 Nicotine dependence, cigarettes, uncomplicated: Secondary | ICD-10-CM | POA: Insufficient documentation

## 2017-06-27 DIAGNOSIS — S0990XA Unspecified injury of head, initial encounter: Secondary | ICD-10-CM | POA: Diagnosis present

## 2017-06-27 HISTORY — DX: Other psychoactive substance abuse, uncomplicated: F19.10

## 2017-06-27 HISTORY — DX: Other chronic pain: G89.29

## 2017-06-27 LAB — CBC WITH DIFFERENTIAL/PLATELET
BASOS ABS: 0 10*3/uL (ref 0.0–0.1)
BASOS PCT: 0 %
EOS PCT: 2 %
Eosinophils Absolute: 0.2 10*3/uL (ref 0.0–0.7)
HEMATOCRIT: 42.6 % (ref 36.0–46.0)
Hemoglobin: 14.1 g/dL (ref 12.0–15.0)
Lymphocytes Relative: 10 %
Lymphs Abs: 1.2 10*3/uL (ref 0.7–4.0)
MCH: 30.1 pg (ref 26.0–34.0)
MCHC: 33.1 g/dL (ref 30.0–36.0)
MCV: 90.8 fL (ref 78.0–100.0)
MONO ABS: 0.8 10*3/uL (ref 0.1–1.0)
MONOS PCT: 6 %
NEUTROS ABS: 10.4 10*3/uL — AB (ref 1.7–7.7)
Neutrophils Relative %: 82 %
PLATELETS: 232 10*3/uL (ref 150–400)
RBC: 4.69 MIL/uL (ref 3.87–5.11)
RDW: 13.1 % (ref 11.5–15.5)
WBC: 12.6 10*3/uL — ABNORMAL HIGH (ref 4.0–10.5)

## 2017-06-27 LAB — BASIC METABOLIC PANEL
ANION GAP: 6 (ref 5–15)
BUN: 14 mg/dL (ref 6–20)
CALCIUM: 8.8 mg/dL — AB (ref 8.9–10.3)
CO2: 26 mmol/L (ref 22–32)
CREATININE: 0.73 mg/dL (ref 0.44–1.00)
Chloride: 103 mmol/L (ref 101–111)
Glucose, Bld: 97 mg/dL (ref 65–99)
POTASSIUM: 4.2 mmol/L (ref 3.5–5.1)
SODIUM: 135 mmol/L (ref 135–145)

## 2017-06-27 LAB — I-STAT BETA HCG BLOOD, ED (MC, WL, AP ONLY): I-stat hCG, quantitative: <5 m[IU]/mL (ref <5)

## 2017-06-27 MED ORDER — BACITRACIN ZINC 500 UNIT/GM EX OINT
1.0000 "application " | TOPICAL_OINTMENT | Freq: Two times a day (BID) | CUTANEOUS | Status: DC
  Filled 2017-06-27: qty 0.9

## 2017-06-27 MED ORDER — MORPHINE SULFATE (PF) 4 MG/ML IV SOLN
4.0000 mg | Freq: Once | INTRAVENOUS | Status: AC
  Administered 2017-06-27: 4 mg via INTRAVENOUS
  Filled 2017-06-27: qty 1

## 2017-06-27 MED ORDER — IOPAMIDOL (ISOVUE-300) INJECTION 61%
100.0000 mL | Freq: Once | INTRAVENOUS | Status: AC | PRN
  Administered 2017-06-27: 100 mL via INTRAVENOUS

## 2017-06-27 MED ORDER — CYCLOBENZAPRINE HCL 10 MG PO TABS: 10.0000 mg | ORAL_TABLET | Freq: Two times a day (BID) | ORAL | 0 refills | Status: AC | PRN

## 2017-06-27 MED ORDER — IBUPROFEN 800 MG PO TABS: 800.0000 mg | ORAL_TABLET | Freq: Three times a day (TID) | ORAL | 0 refills | Status: AC

## 2017-06-27 NOTE — Discharge Instructions (Signed)
Ice and elevation of the head Keep antibiotic ointment on the open wounds Sterile dressing for 1 week ER for worsening symtpoms. Let your doctor know about your abnormal CT scan - they will need to get an MRI.
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
## 2017-06-27 NOTE — ED Notes (Signed)
Pt taken to ct 
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
## 2018-10-19 ENCOUNTER — Other Ambulatory Visit: Payer: Self-pay

## 2018-10-19 ENCOUNTER — Emergency Department (HOSPITAL_COMMUNITY)
Admission: EM | Admit: 2018-10-19 | Discharge: 2018-10-20 | Disposition: A | Discharge status: Home / Self Care | Payer: Self-pay | Attending: Emergency Medicine | Admitting: Emergency Medicine

## 2018-10-19 ENCOUNTER — Encounter (HOSPITAL_COMMUNITY): Payer: Self-pay

## 2018-10-19 DIAGNOSIS — F17210 Nicotine dependence, cigarettes, uncomplicated: Secondary | ICD-10-CM | POA: Insufficient documentation

## 2018-10-19 DIAGNOSIS — R5383 Other fatigue: Secondary | ICD-10-CM | POA: Insufficient documentation

## 2018-10-19 DIAGNOSIS — F1110 Opioid abuse, uncomplicated: Secondary | ICD-10-CM | POA: Insufficient documentation

## 2018-10-19 NOTE — ED Triage Notes (Signed)
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

## 2018-10-19 NOTE — ED Notes (Signed)
Bed: [facility] Expected date:  Expected time:  Means of arrival:  Comments: 

## 2018-10-19 NOTE — ED Provider Notes (Signed)
[facility]-EMERGENCY DEPT Provider Note   CSN: [id number] Arrival date & time: 10/19/18  2238    History   Chief Complaint Chief Complaint  Patient presents with  . heroin detox    HPI Lauren Hunt is a 38 y.o. female.     Lauren Hunt is a 38 y.o. female with a history of substance abuse and chronic pain, who presents to the emergency department requesting detox from heroin.  Patient reports that she last used this morning and has been using heroin for 3 years, initially started with pills.  She reports that she snorts the heroin and never injects it.  She reports that she is feeling a bit fatigued and unwell today but denies any focal symptoms.  No fevers or chills, chest pain, shortness of breath, abdominal pain, nausea, vomiting, headaches or vision changes.  Denies any focal medical complaints today.  Denies any SI, HI or AVH.     Past Medical History:  Diagnosis Date  . Chronic pain   . Substance abuse (HCC)     There are no active problems to display for this patient.   Past Surgical History:  Procedure Laterality Date  . NOSE SURGERY       OB History   No obstetric history on file.      Home Medications    Prior to Admission medications   Medication Sig Start Date End Date Taking? Authorizing Provider  cephALEXin (KEFLEX) 500 MG capsule Take 1 capsule (500 mg total) by mouth 4 (four) times daily. For 7 days Patient not taking: Reported on 06/27/2017 10/20/13   Pauline Aus, PA-C  cyclobenzaprine (FLEXERIL) 10 MG tablet Take 1 tablet (10 mg total) 2 (two) times daily as needed by mouth for muscle spasms. Patient not taking: Reported on 10/19/2018 06/27/17   Eber Hong, MD  HYDROcodone-acetaminophen (NORCO/VICODIN) 5-325 MG per tablet Take one-two tabs po q 4-6 hrs prn pain Patient not taking: Reported on 06/27/2017 10/20/13   Triplett, Tammy, PA-C  ibuprofen (ADVIL,MOTRIN) 800 MG tablet Take 1 tablet (800 mg total) 3 (three) times  daily by mouth. Patient not taking: Reported on 10/19/2018 06/27/17   Eber Hong, MD    Family History History reviewed. No pertinent family history.  Social History Social History   Tobacco Use  . Smoking status: Current Every Day Smoker    Packs/day: 0.50    Types: Cigarettes  . Smokeless tobacco: Never Used  Substance Use Topics  . Alcohol use: Yes  . Drug use: Yes     Allergies   Patient has no known allergies.   Review of Systems Review of Systems  Constitutional: Positive for fatigue. Negative for chills and fever.  HENT: Negative.   Eyes: Negative for visual disturbance.  Respiratory: Negative for cough and shortness of breath.   Cardiovascular: Negative for chest pain.  Gastrointestinal: Negative for abdominal pain, nausea and vomiting.  Musculoskeletal: Negative for arthralgias and myalgias.  Skin: Negative for color change, rash and wound.  Neurological: Negative for headaches.  Psychiatric/Behavioral: Negative for suicidal ideas.  All other systems reviewed and are negative.    Physical Exam Updated Vital Signs BP (!) 115/105 (BP Location: Left Arm)   Pulse 96   Temp (!) 97.5 F (36.4 C) (Oral)   Resp 20   Ht 5' 1.5" (1.562 m)   Wt 61.2 kg   LMP 10/13/2018   SpO2 99%   BMI 25.09 kg/m   Physical Exam Vitals signs and nursing note  reviewed.  Constitutional:      General: She is not in acute distress.    Appearance: Normal appearance. She is well-developed and normal weight. She is not ill-appearing or diaphoretic.  HENT:     Head: Normocephalic and atraumatic.  Eyes:     General:        Right eye: No discharge.        Left eye: No discharge.  Neck:     Musculoskeletal: Neck supple.  Cardiovascular:     Rate and Rhythm: Normal rate and regular rhythm.     Pulses: Normal pulses.     Heart sounds: Normal heart sounds.  Pulmonary:     Effort: Pulmonary effort is normal. No respiratory distress.     Breath sounds: Normal breath sounds.      Comments: Respirations equal and unlabored, patient able to speak in full sentences, lungs clear to auscultation bilaterally Abdominal:     General: Abdomen is flat. Bowel sounds are normal.     Palpations: Abdomen is soft. There is no mass.     Tenderness: There is no abdominal tenderness. There is no guarding.     Comments: Abdomen soft, nondistended, nontender to palpation in all quadrants without guarding or peritoneal signs  Musculoskeletal:        General: No deformity.  Skin:    General: Skin is warm and dry.     Capillary Refill: Capillary refill takes less than 2 seconds.  Neurological:     Mental Status: She is alert and oriented to person, place, and time.     Coordination: Coordination normal.  Psychiatric:        Mood and Affect: Mood normal.        Behavior: Behavior normal.      ED Treatments / Results  Labs (all labs ordered are listed, but only abnormal results are displayed) Labs Reviewed - No data to display  EKG None  Radiology No results found.  Procedures Procedures (including critical care time)  Medications Ordered in ED Medications - No data to display   Initial Impression / Assessment and Plan / ED Course  I have reviewed the triage vital signs and the nursing notes.  Pertinent labs & imaging results that were available during my care of the patient were reviewed by me and considered in my medical decision making (see chart for details).  Patient presents requesting heroin detox, last used this morning and has been using heroin for the last 3 years, snorts but does not inject.  Denies any focal medical complaints, vitals stable and patient is well-appearing.  Explained to the patient that we do not admit for detox here but will provide her with resources for substance abuse treatment and peer support consult placed as well.  Patient expresses understanding and agreement with this plan.  Discharged home in good condition.  Final Clinical  Impressions(s) / ED Diagnoses   Final diagnoses:  Heroin abuse [facility])    ED Discharge Orders    None       Legrand Rams 10/20/18 0006    Dione Booze, MD 10/20/18 [phone]

## 2018-10-20 NOTE — Discharge Instructions (Addendum)
Please use the resources provided to get help with your heroin detox, I have also placed a consult to our peer support counselors who will reach out to you to help provide additional resources.

## 2023-07-08 ENCOUNTER — Encounter (HOSPITAL_COMMUNITY): Payer: Self-pay | Admitting: Emergency Medicine

## 2023-07-08 ENCOUNTER — Other Ambulatory Visit: Payer: Self-pay

## 2023-07-08 ENCOUNTER — Emergency Department (HOSPITAL_COMMUNITY): Admission: EM | Admit: 2023-07-08 | Discharge: 2023-07-08 | Discharge status: Against Medical Advice | Payer: Self-pay

## 2023-07-08 ENCOUNTER — Emergency Department (HOSPITAL_COMMUNITY): Payer: Self-pay

## 2023-07-08 DIAGNOSIS — L03116 Cellulitis of left lower limb: Secondary | ICD-10-CM | POA: Insufficient documentation

## 2023-07-08 DIAGNOSIS — L03115 Cellulitis of right lower limb: Secondary | ICD-10-CM | POA: Insufficient documentation

## 2023-07-08 DIAGNOSIS — L03119 Cellulitis of unspecified part of limb: Secondary | ICD-10-CM

## 2023-07-08 DIAGNOSIS — Z5329 Procedure and treatment not carried out because of patient's decision for other reasons: Secondary | ICD-10-CM | POA: Insufficient documentation

## 2023-07-08 HISTORY — DX: Anxiety disorder, unspecified: F41.9

## 2023-07-08 LAB — CBC WITH DIFFERENTIAL/PLATELET
Abs Immature Granulocytes: 0.04 10*3/uL (ref 0.00–0.07)
Basophils Absolute: 0 10*3/uL (ref 0.0–0.1)
Basophils Relative: 0 %
Eosinophils Absolute: 0.2 10*3/uL (ref 0.0–0.5)
Eosinophils Relative: 2 %
HCT: 32.8 % — ABNORMAL LOW (ref 36.0–46.0)
Hemoglobin: 9.7 g/dL — ABNORMAL LOW (ref 12.0–15.0)
Immature Granulocytes: 1 %
Lymphocytes Relative: 23 %
Lymphs Abs: 1.7 10*3/uL (ref 0.7–4.0)
MCH: 21.9 pg — ABNORMAL LOW (ref 26.0–34.0)
MCHC: 29.6 g/dL — ABNORMAL LOW (ref 30.0–36.0)
MCV: 74.2 fL — ABNORMAL LOW (ref 80.0–100.0)
Monocytes Absolute: 0.7 10*3/uL (ref 0.1–1.0)
Monocytes Relative: 10 %
Neutro Abs: 4.7 10*3/uL (ref 1.7–7.7)
Neutrophils Relative %: 64 %
Platelets: 364 10*3/uL (ref 150–400)
RBC: 4.42 MIL/uL (ref 3.87–5.11)
RDW: 15 % (ref 11.5–15.5)
WBC: 7.4 10*3/uL (ref 4.0–10.5)
nRBC: 0 % (ref 0.0–0.2)

## 2023-07-08 LAB — URINALYSIS, ROUTINE W REFLEX MICROSCOPIC
Bilirubin Urine: NEGATIVE
Glucose, UA: NEGATIVE mg/dL
Ketones, ur: NEGATIVE mg/dL
Nitrite: POSITIVE — AB
Protein, ur: NEGATIVE mg/dL
Specific Gravity, Urine: 1.016 (ref 1.005–1.030)
pH: 7 (ref 5.0–8.0)

## 2023-07-08 LAB — COMPREHENSIVE METABOLIC PANEL
ALT: 11 U/L (ref 0–44)
AST: 12 U/L — ABNORMAL LOW (ref 15–41)
Albumin: 3 g/dL — ABNORMAL LOW (ref 3.5–5.0)
Alkaline Phosphatase: 133 U/L — ABNORMAL HIGH (ref 38–126)
Anion gap: 9 (ref 5–15)
BUN: 8 mg/dL (ref 6–20)
CO2: 21 mmol/L — ABNORMAL LOW (ref 22–32)
Calcium: 8.2 mg/dL — ABNORMAL LOW (ref 8.9–10.3)
Chloride: 100 mmol/L (ref 98–111)
Creatinine, Ser: 0.6 mg/dL (ref 0.44–1.00)
GFR, Estimated: >60 mL/min (ref >60)
Glucose, Bld: 101 mg/dL — ABNORMAL HIGH (ref 70–99)
Potassium: 3.6 mmol/L (ref 3.5–5.1)
Sodium: 130 mmol/L — ABNORMAL LOW (ref 135–145)
Total Bilirubin: 0.4 mg/dL (ref <1.2)
Total Protein: 7.2 g/dL (ref 6.5–8.1)

## 2023-07-08 LAB — RAPID URINE DRUG SCREEN, HOSP PERFORMED
Amphetamines: NOT DETECTED
Barbiturates: NOT DETECTED
Benzodiazepines: NOT DETECTED
Cocaine: POSITIVE — AB
Opiates: NOT DETECTED
Tetrahydrocannabinol: NOT DETECTED

## 2023-07-08 LAB — LACTIC ACID, PLASMA: Lactic Acid, Venous: 1.1 mmol/L (ref 0.5–1.9)

## 2023-07-08 LAB — ACETAMINOPHEN LEVEL: Acetaminophen (Tylenol), Serum: <10 ug/mL — ABNORMAL LOW (ref 10–30)

## 2023-07-08 LAB — POC URINE PREG, ED: Preg Test, Ur: NEGATIVE

## 2023-07-08 LAB — ETHANOL: Alcohol, Ethyl (B): <10 mg/dL (ref <10)

## 2023-07-08 LAB — SALICYLATE LEVEL: Salicylate Lvl: <7 mg/dL — ABNORMAL LOW (ref 7.0–30.0)

## 2023-07-08 MED ORDER — CEPHALEXIN 500 MG PO CAPS: 500.0000 mg | ORAL_CAPSULE | Freq: Four times a day (QID) | ORAL | 0 refills | Status: AC

## 2023-07-08 MED ORDER — CEFAZOLIN SODIUM-DEXTROSE 1-4 GM/50ML-% IV SOLN
1.0000 g | Freq: Once | INTRAVENOUS | Status: DC
  Filled 2023-07-08: qty 50

## 2023-07-08 MED ORDER — LACTATED RINGERS IV BOLUS (SEPSIS)
1000.0000 mL | Freq: Once | INTRAVENOUS | Status: AC
  Administered 2023-07-08: 1000 mL via INTRAVENOUS

## 2023-07-08 MED ORDER — SULFAMETHOXAZOLE-TRIMETHOPRIM 800-160 MG PO TABS: 1.0000 | ORAL_TABLET | Freq: Two times a day (BID) | ORAL | 0 refills | Status: AC

## 2023-07-08 MED ORDER — OXYCODONE-ACETAMINOPHEN 5-325 MG PO TABS
1.0000 | ORAL_TABLET | Freq: Once | ORAL | Status: AC
  Administered 2023-07-08: 1 via ORAL
  Filled 2023-07-08: qty 1

## 2023-07-08 MED ORDER — NICOTINE 21 MG/24HR TD PT24
21.0000 mg | MEDICATED_PATCH | Freq: Once | TRANSDERMAL | Status: DC
  Administered 2023-07-08: 21 mg via TRANSDERMAL
  Filled 2023-07-08: qty 1

## 2023-07-08 MED ORDER — ONDANSETRON 8 MG PO TBDP
8.0000 mg | ORAL_TABLET | Freq: Once | ORAL | Status: AC
  Administered 2023-07-08: 8 mg via ORAL
  Filled 2023-07-08: qty 1

## 2023-07-08 NOTE — ED Notes (Signed)
Matt paramedic attempting IV access at this time

## 2023-07-08 NOTE — ED Notes (Signed)
Pt requesting to leave.  States she has to take care of her mother and will try to come back.  PA Triplett aware.

## 2023-07-08 NOTE — ED Notes (Signed)
BP cuff removed for lab draw attempts

## 2023-07-08 NOTE — ED Notes (Signed)
2nd phlebotomist attempted to collect labs, unsuccessful, Tee, MD notified

## 2023-07-08 NOTE — ED Triage Notes (Signed)
Pt c/o right lower arm infection and drainage, pt is IV drug user and states she thinks there is a needle broken off in her arm, purulent drainage noted

## 2023-07-08 NOTE — ED Notes (Signed)
Phlebotomy unable to collect labs, 2nd person to attempt

## 2023-07-08 NOTE — ED Provider Notes (Signed)
[place] EMERGENCY DEPARTMENT AT [facility] Provider Note   CSN: [id number] Arrival date & time: 07/08/23  0847     History  Chief Complaint  Patient presents with   Arm Injury    Lauren Hunt is a 42 y.o. female.   Arm Injury Associated symptoms: fatigue   Associated symptoms: no fever        Lauren Hunt is a 42 y.o. female past medical history of chronic pain anxiety and substance abuse.  She admits to IV drug use and injects cocaine and fentanyl.  Last used last evening between 7 and 8 PM.  She complains of intermittent fever, nausea and pain of bilateral forearms.  She is concerned that she has a needle that broke off in her arm approximately 1 month ago.  States there may be more.  Pain with palpation of her bilateral forearms.  She has open wounds to both arms.  No significant drainage.  States that she "picks" at these areas as well.  Denies any vomiting, had 1 episode of diarrhea.  Subjective fever of 102 at home, fever temporarily improves after ibuprofen.  Denies any chest pain or shortness of breath.    Home Medications Prior to Admission medications   Medication Sig Start Date End Date Taking? Authorizing Provider  cephALEXin (KEFLEX) 500 MG capsule Take 1 capsule (500 mg total) by mouth 4 (four) times daily. For 7 days Patient not taking: Reported on 06/27/2017 10/20/13   Pauline Aus, PA-C  cyclobenzaprine (FLEXERIL) 10 MG tablet Take 1 tablet (10 mg total) 2 (two) times daily as needed by mouth for muscle spasms. Patient not taking: Reported on 10/19/2018 06/27/17   Eber Hong, MD  HYDROcodone-acetaminophen (NORCO/VICODIN) 5-325 MG per tablet Take one-two tabs po q 4-6 hrs prn pain Patient not taking: Reported on 06/27/2017 10/20/13   Earnest Mcgillis, PA-C  ibuprofen (ADVIL,MOTRIN) 800 MG tablet Take 1 tablet (800 mg total) 3 (three) times daily by mouth. Patient not taking: Reported on 10/19/2018 06/27/17   Eber Hong, MD      Allergies     Wellbutrin [bupropion]    Review of Systems   Review of Systems  Constitutional:  Positive for fatigue. Negative for chills and fever.  Respiratory:  Negative for shortness of breath and wheezing.   Cardiovascular:  Negative for chest pain.  Gastrointestinal:  Positive for nausea. Negative for abdominal pain and vomiting.  Genitourinary:  Negative for dysuria and flank pain.  Musculoskeletal:  Positive for arthralgias.  Skin:  Positive for color change and wound.       Open wounds to bilateral forearms  Neurological:  Negative for dizziness, syncope and weakness.  Psychiatric/Behavioral:  Negative for confusion.     Physical Exam Updated Vital Signs BP (!) 118/102 (BP Location: Left Arm)   Pulse 99   Temp 98.2 F (36.8 C) (Oral)   Resp 20   Ht 5\' 2"  (1.575 m)   Wt 67 kg   LMP 06/29/2023 (Approximate)   SpO2 100%   BMI 27.00 kg/m  Physical Exam Vitals and nursing note reviewed.  Constitutional:      Appearance: Normal appearance. She is not toxic-appearing.     Comments: Pt appears anxious and tearful  HENT:     Mouth/Throat:     Mouth: Mucous membranes are moist.     Pharynx: Oropharynx is clear.  Eyes:     Extraocular Movements: Extraocular movements intact.     Conjunctiva/sclera: Conjunctivae normal.  Pupils: Pupils are equal, round, and reactive to light.  Cardiovascular:     Rate and Rhythm: Normal rate and regular rhythm.  Pulmonary:     Effort: Pulmonary effort is normal.     Breath sounds: Normal breath sounds.  Abdominal:     General: There is no distension.     Palpations: Abdomen is soft.     Tenderness: There is no abdominal tenderness. There is no guarding or rebound.  Musculoskeletal:        General: Swelling and tenderness present.     Right forearm: Swelling and tenderness present. No edema.     Left forearm: Swelling and tenderness present.     Cervical back: Normal range of motion. No rigidity or tenderness.     Comments: Patient with  chronic appearing, open wounds to the bilateral forearms.  No purulent drainage.  Surrounding erythema right greater than left moderate induration of bilateral forearms.  See attached photos  Lymphadenopathy:     Cervical: No cervical adenopathy.  Skin:    General: Skin is warm.     Capillary Refill: Capillary refill takes less than 2 seconds.     Findings: Erythema present.  Neurological:     General: No focal deficit present.     Mental Status: She is alert.     Sensory: No sensory deficit.     Motor: No weakness.          Patient gave verbal consent for images to be stored in medical record.   ED Results / Procedures / Treatments   Labs (all labs ordered are listed, but only abnormal results are displayed) Labs Reviewed  COMPREHENSIVE METABOLIC PANEL - Abnormal; Notable for the following components:      Result Value   Sodium 130 (*)    CO2 21 (*)    Glucose, Bld 101 (*)    Calcium 8.2 (*)    Albumin 3.0 (*)    AST 12 (*)    Alkaline Phosphatase 133 (*)    All other components within normal limits  CBC WITH DIFFERENTIAL/PLATELET - Abnormal; Notable for the following components:   Hemoglobin 9.7 (*)    HCT 32.8 (*)    MCV 74.2 (*)    MCH 21.9 (*)    MCHC 29.6 (*)    All other components within normal limits  URINALYSIS, ROUTINE W REFLEX MICROSCOPIC - Abnormal; Notable for the following components:   APPearance HAZY (*)    Hgb urine dipstick SMALL (*)    Nitrite POSITIVE (*)    Leukocytes,Ua TRACE (*)    Bacteria, UA RARE (*)    All other components within normal limits  RAPID URINE DRUG SCREEN, HOSP PERFORMED - Abnormal; Notable for the following components:   Cocaine POSITIVE (*)    All other components within normal limits  ACETAMINOPHEN LEVEL - Abnormal; Notable for the following components:   Acetaminophen (Tylenol), Serum <10 (*)    All other components within normal limits  SALICYLATE LEVEL - Abnormal; Notable for the following components:    Salicylate Lvl <7.0 (*)    All other components within normal limits  CULTURE, BLOOD (ROUTINE X 2)  CULTURE, BLOOD (ROUTINE X 2)  LACTIC ACID, PLASMA  ETHANOL  POC URINE PREG, ED    EKG EKG Interpretation Date/Time:  Tuesday July 08 2023 09:47:14 EST Ventricular Rate:  80 PR Interval:  141 QRS Duration:  75 QT Interval:  351 QTC Calculation: 405 R Axis:   79  Text Interpretation:  Sinus rhythm Borderline repolarization abnormality Confirmed by Beckey Downing [phone]) on 07/08/2023 9:56:14 AM  Radiology No results found.  Procedures Procedures    Medications Ordered in ED Medications  lactated ringers bolus 1,000 mL (has no administration in time range)    ED Course/ Medical Decision Making/ A&P                                 Medical Decision Making Patient with history of IV drug use, injects cocaine and fentanyl last used around 7 to 8 PM last evening.  Here for pain and open wounds of bilateral forearms.  Concern that she may have broken needles in both arms.  Subjective fever at home improves after taking ibuprofen.  Differential would include but not limited to sepsis secondary to cellulitis of the arms, abscess, cellulitis without sepsis  On exam, patient does not appear critically ill, but has significant open and weeping wounds to bilateral forearms  Amount and/or Complexity of Data Reviewed Labs: ordered.    Details: Labs interpreted by me, no leukocytosis, chemistries show sodium of 130 kidney functions unremarkable, acetaminophen and salicylate levels unremarkable, blood alcohol less than 10.  Lactic acid unremarkable urine pregnancy negative, urinalysis shows trace leukocytes positive nitrates hazy urine.  UDS positive for cocaine.  Urine culture pending Radiology: ordered.    Details: Chest x-ray and x-ray of the bilateral forearms were ordered.  Results are pending.  On my clinical interpretation of x-rays of the bilateral forearms there appeared to be  multiple foreign bodies to both arms appears consistent with needles ECG/medicine tests: ordered.    Details: EKG shows sinus rhythm borderline repol Discussion of management or test interpretation with external provider(s): Patient initially presented with concern for sepsis secondary to cellulitis from IV drug use.  Has open wounds nonpurulent to bilateral forearms moderate induration.  Patient requesting nicotine patch pain medication and nausea medicine.  Workup thus far showing cellulitis without sepsis.  I am recommending hospital admission for IV antibiotics and consultation with general surgery regarding foreign bodies  I was informed by nursing staff that patient stated to nurse that she had a court appointment today and requested documentation that she was here in the ER, shortly after, patient stated to the nurse that she needed to leave as she had a elderly mother at home to care for who was by herself.  I spoke to the patient and informed her of risk of leaving including death and possible loss of limb.  She verbalized understanding stated that she will arrange for someone to care for her mother and return to the ER for completion of treatment.  Agreeable to sign out AGAINST MEDICAL ADVICE understands that she may return at any time for further treatment.  I have prescribed Bactrim and Keflex and sent to her pharmacy of record             Final Clinical Impression(s) / ED Diagnoses Final diagnoses:  Cellulitis of forearm    Rx / DC Orders ED Discharge Orders     None         Pauline Aus, PA-C 07/08/23 1335    Durwin Glaze, MD 07/08/23 [phone]

## 2023-07-11 LAB — URINE CULTURE: Culture: >=100000 — AB

## 2023-07-12 ENCOUNTER — Telehealth (HOSPITAL_BASED_OUTPATIENT_CLINIC_OR_DEPARTMENT_OTHER): Payer: Self-pay | Admitting: *Deleted

## 2023-07-12 NOTE — Telephone Encounter (Signed)
Post ED Visit - Positive Culture Follow-up  Culture report reviewed by antimicrobial stewardship pharmacist: [facility] []  Enzo Bi, Pharm.D. []  Celedonio Miyamoto, Pharm.D., BCPS AQ-ID []  Garvin Fila, Pharm.D., BCPS []  Georgina Pillion, [address].D., BCPS []  [place], [address].D., BCPS, AAHIVP []  Estella Husk, Pharm.D., BCPS, AAHIVP []  Lysle Pearl, PharmD, BCPS []  Phillips Climes, PharmD, BCPS []  Agapito Games, PharmD, BCPS []  Verlan Friends, PharmD []  Mervyn Gay, PharmD, BCPS [x]  Delmar Landau, PharmD  [facility] []  Len Childs, PharmD []  Greer Pickerel, PharmD []  Adalberto Cole, PharmD []  Perlie Gold, Rph []  Lonell Face) Jean Rosenthal, PharmD []  Earl Many, PharmD []  Junita Push, PharmD []  Dorna Leitz, PharmD []  Terrilee Files, PharmD []  Lynann Beaver, PharmD []  Keturah Barre, PharmD []  Loralee Pacas, PharmD []  Bernadene Person, PharmD   Positive urine culture Treated with Cephalexin, organism sensitive to the same and no further patient follow-up is required at this time.  Lauren Hunt 07/12/2023, 12:31 PM

## 2023-07-13 LAB — CULTURE, BLOOD (ROUTINE X 2)
Culture: NO GROWTH
Culture: NO GROWTH
Special Requests: ADEQUATE
Special Requests: ADEQUATE
